# Patient Record
Sex: Female | Born: 1970 | Race: White | Hispanic: No | Marital: Single | Smoking: Never smoker
Health system: Southern US, Community
[De-identification: ages and names within clinical notes are randomized; demographics above are authoritative.]

---

## 2007-05-31 ENCOUNTER — Observation Stay: Payer: Self-pay | Admitting: Unknown Physician Specialty

## 2007-06-28 ENCOUNTER — Ambulatory Visit: Payer: Self-pay | Admitting: Obstetrics and Gynecology

## 2007-07-12 ENCOUNTER — Observation Stay: Payer: Self-pay

## 2007-07-22 ENCOUNTER — Ambulatory Visit: Payer: Self-pay | Admitting: Obstetrics and Gynecology

## 2007-08-21 ENCOUNTER — Ambulatory Visit: Payer: Self-pay | Admitting: Obstetrics and Gynecology

## 2007-08-22 ENCOUNTER — Inpatient Hospital Stay: Payer: Self-pay

## 2007-09-26 ENCOUNTER — Ambulatory Visit: Payer: Self-pay | Admitting: Unknown Physician Specialty

## 2007-11-05 ENCOUNTER — Emergency Department: Payer: Self-pay | Admitting: Emergency Medicine

## 2009-08-14 IMAGING — US US THYROID
1 series · 17 of 25 positions shown · non-contrast
Comparison: none

REASON FOR EXAM: thyroid nodule    hypothyroid
COMMENTS:

[Series 1: us thyroid · 17 of 48 slices shown]
[im 1/48]
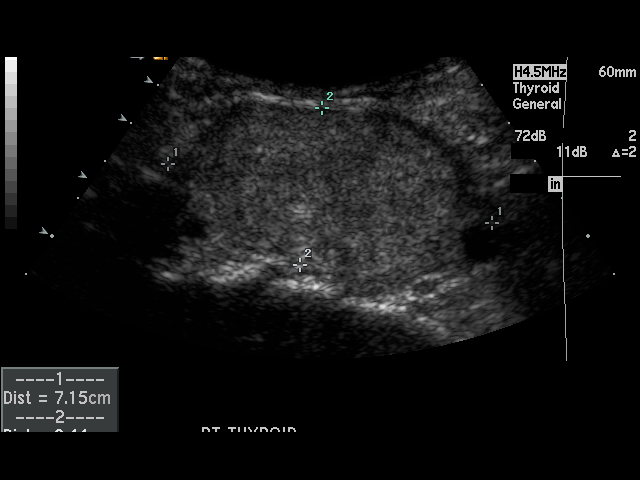
[im 4/48]
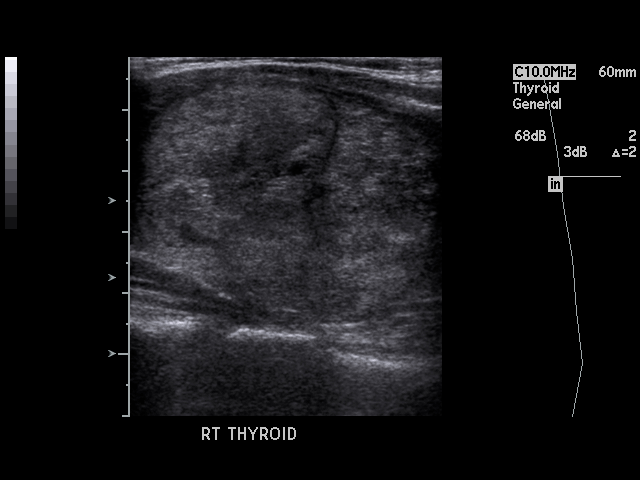
[im 6/48]
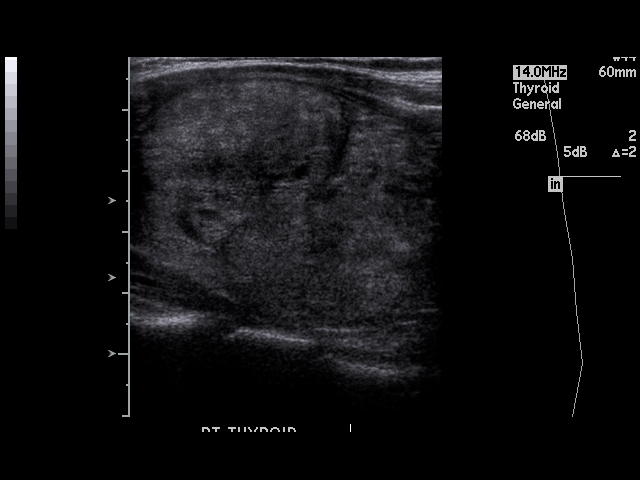
[im 10/48]
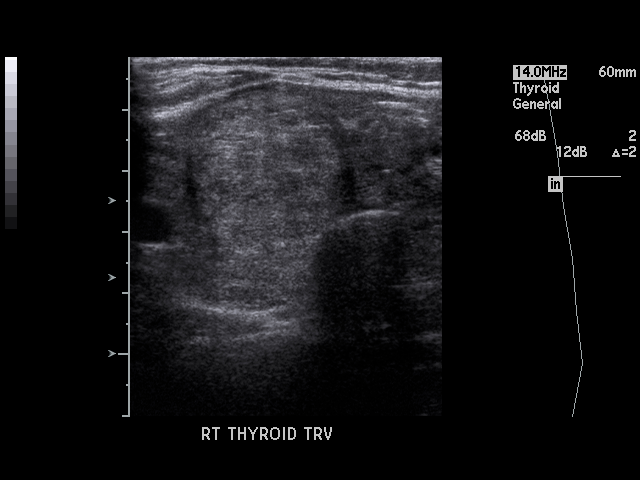
[im 12/48]
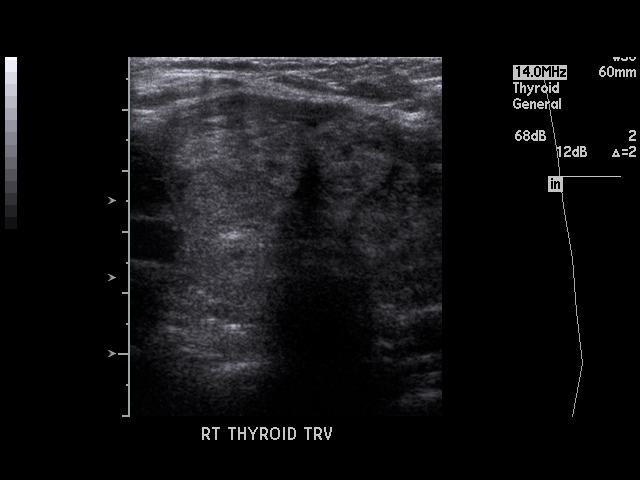
[im 16/48]
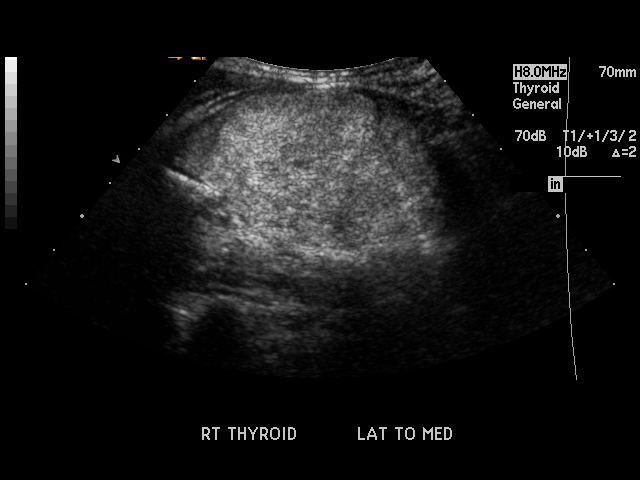
[im 18/48]
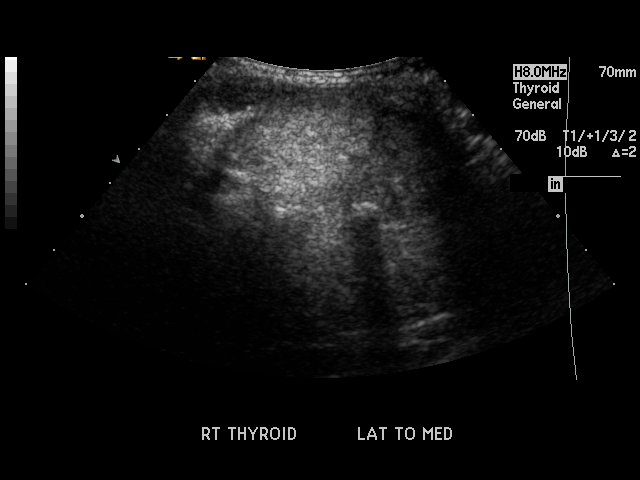
[im 22/48]
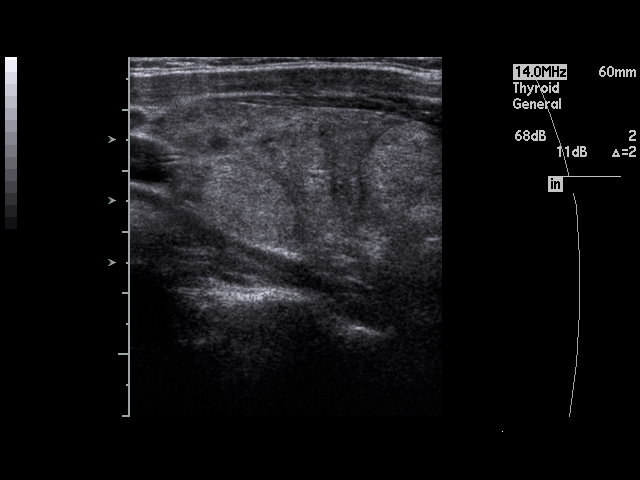
[im 24/48]
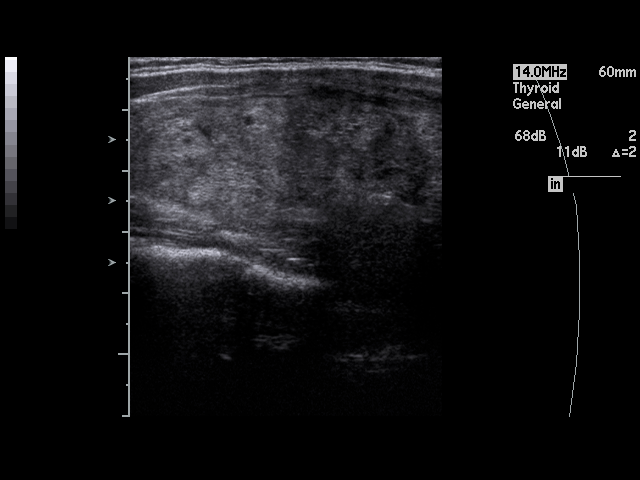
[im 26/48]
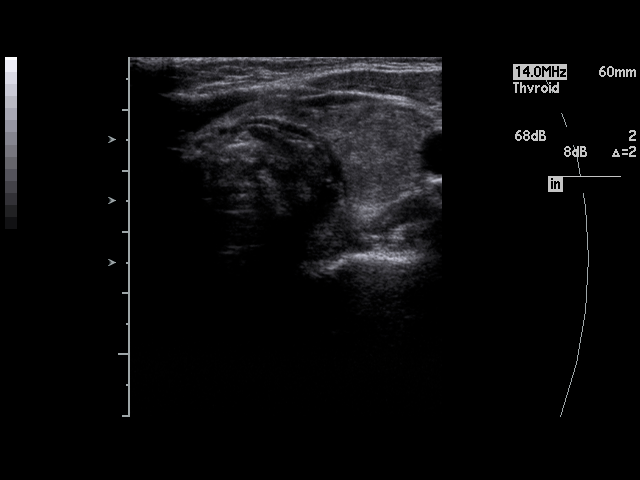
[im 30/48]
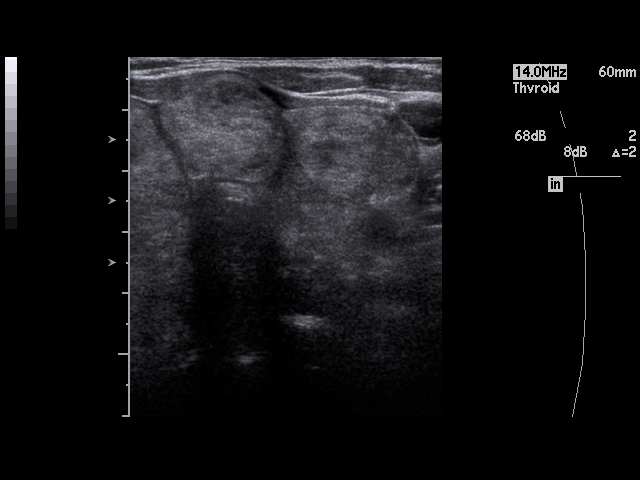
[im 32/48]
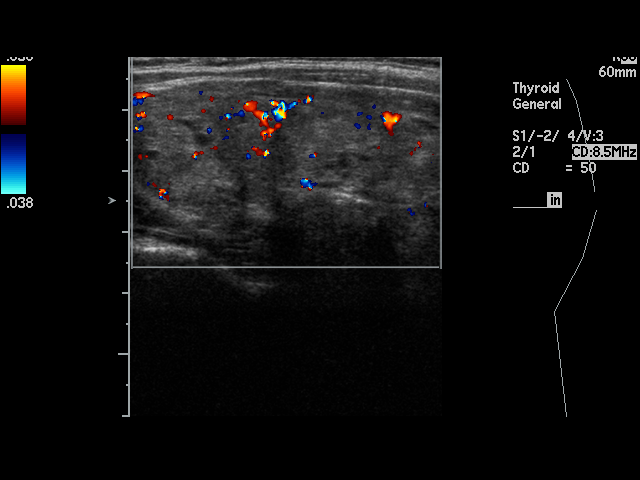
[im 36/48]
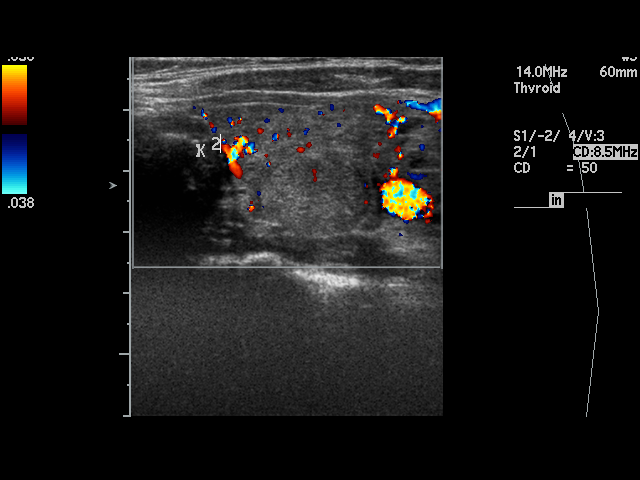
[im 38/48]
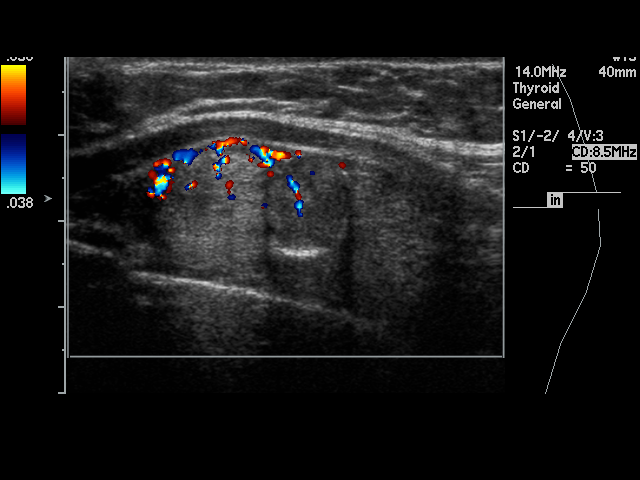
[im 42/48]
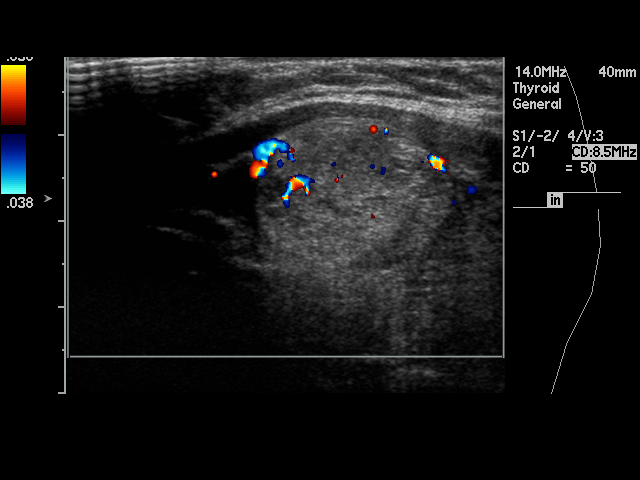
[im 44/48]
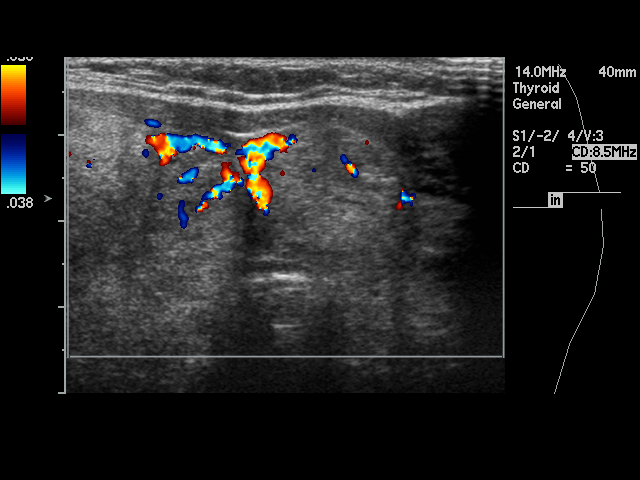
[im 48/48]
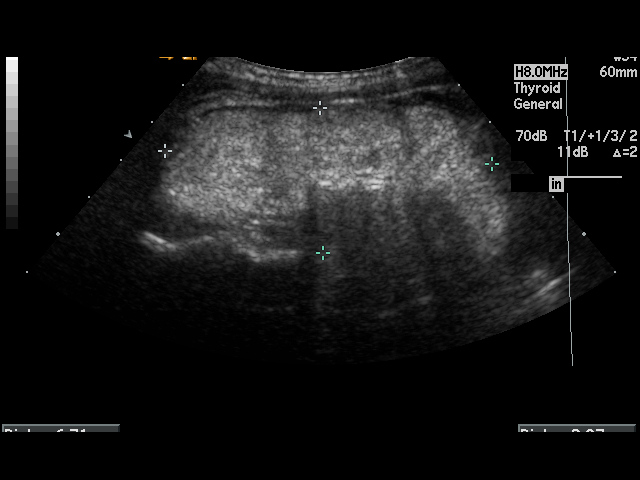

[17 of 25 positions shown; findings below may reference images not displayed]

PROCEDURE:     US  - US THYROID  - September 26, 2007 [DATE]

RESULT:     The RIGHT lobe of the thyroid measures 7.15 cm x 3.44 cm x
cm, and the LEFT lobe measures 6.71 cm x 2.97 cm x 2.71 cm. The thyroid
echotexture is heterogeneous bilaterally. In the mid and lower pole region
of the RIGHT lobe, there is a solid nodule that measures 5.08 cm x 3.47 cm x
3.66 cm. In the lower pole of the LEFT lobe, there is a solid hypoechoic
nodule measuring 1.99 cm at maximum diameter.  In the isthmus, there are two
hypoechoic solid nodules with the larger measuring 2.32 cm at maximum
diameter and the smaller measuring 1.14 cm at maximum diameter.
IMPRESSION: 1.     The thyroid lobes are enlarged.
2.     There is a heterogeneous echotexture to both lobes of the thyroid.
3.     Multiple thyroid nodules are observed as described above. The largest
is in the RIGHT lobe and measures 5.08 cm at maximum outer.
4.     No calcifications in the thyroid lobes or thyroid nodules are
identified.

## 2012-04-25 ENCOUNTER — Ambulatory Visit: Payer: Self-pay

## 2014-03-14 IMAGING — CR RIGHT HIP - COMPLETE 2+ VIEW
1 series · 2 of 2 positions shown · non-contrast
Comparison: none

REASON FOR EXAM: back problems, joint problems carpal tunnel syndrome
COMMENTS:

PROCEDURE:     DXR - DXR HIP RIGHT COMPLETE  - April 25, 2012 [DATE]
RESULT:     Right hip images demonstrate no definite fracture, dislocation
or radiopaque foreign body.

[Series 1: t hip ap right · 0.14mm/px · 2 of 2 slices shown]
[im 1/2]
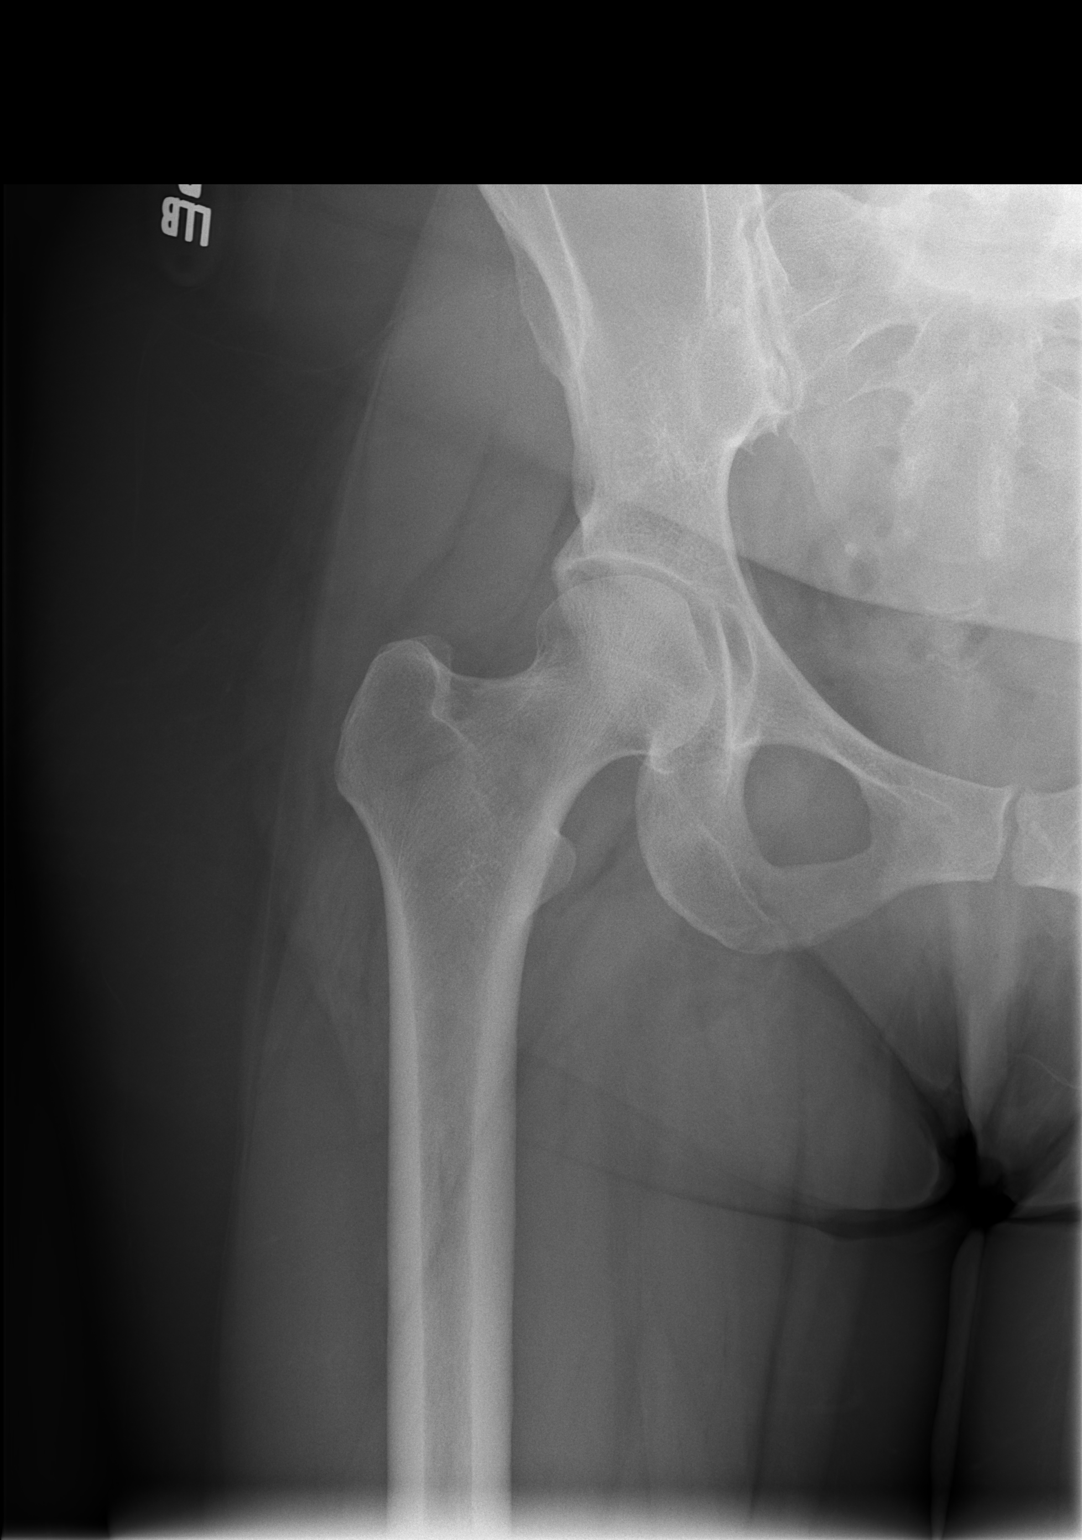
[im 2/2]
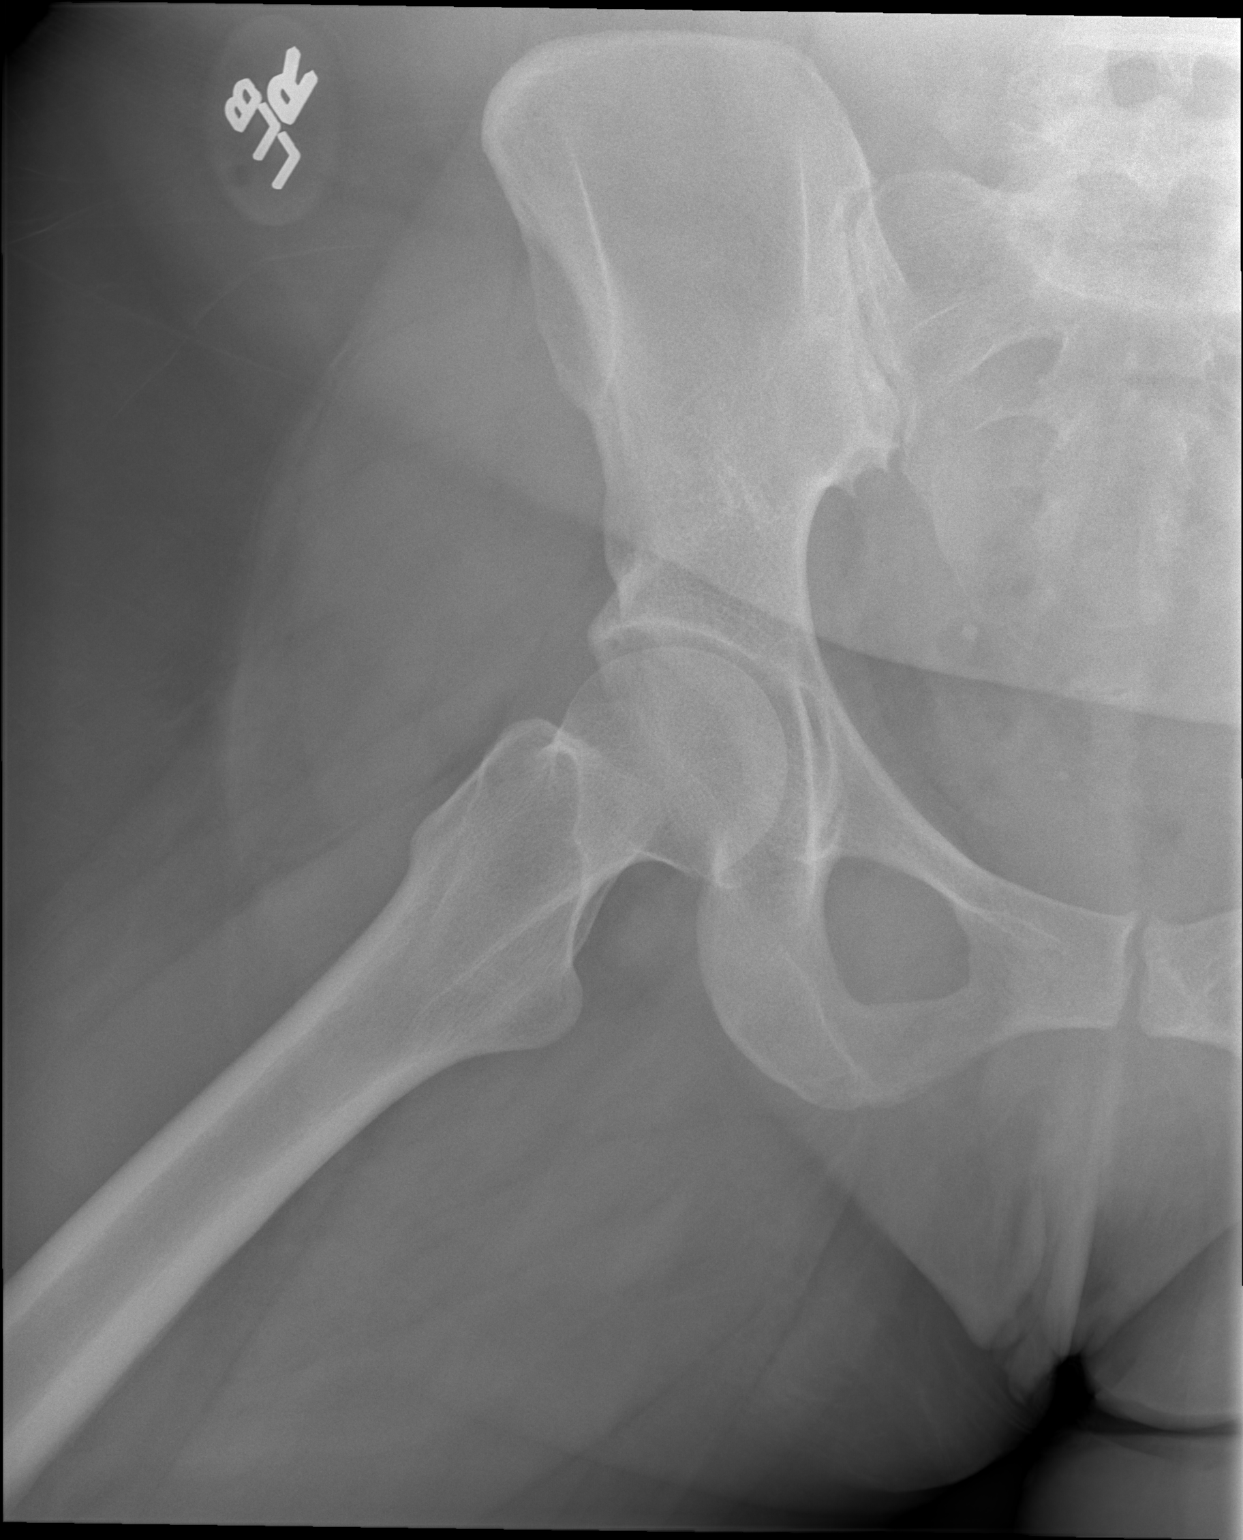

[2 of 2 positions shown; findings below may reference images not displayed]

IMPRESSION: Please see above.

[REDACTED]

## 2014-03-14 IMAGING — CR DG LUMBAR SPINE 2-3V
1 series · 3 of 3 positions shown · non-contrast
Comparison: none

REASON FOR EXAM: back problems, joint problems carpal tunnel syndrome
COMMENTS:

[Series 1: t lumbar spine ap · 0.14mm/px · 3 of 3 slices shown]
[im 1/3]
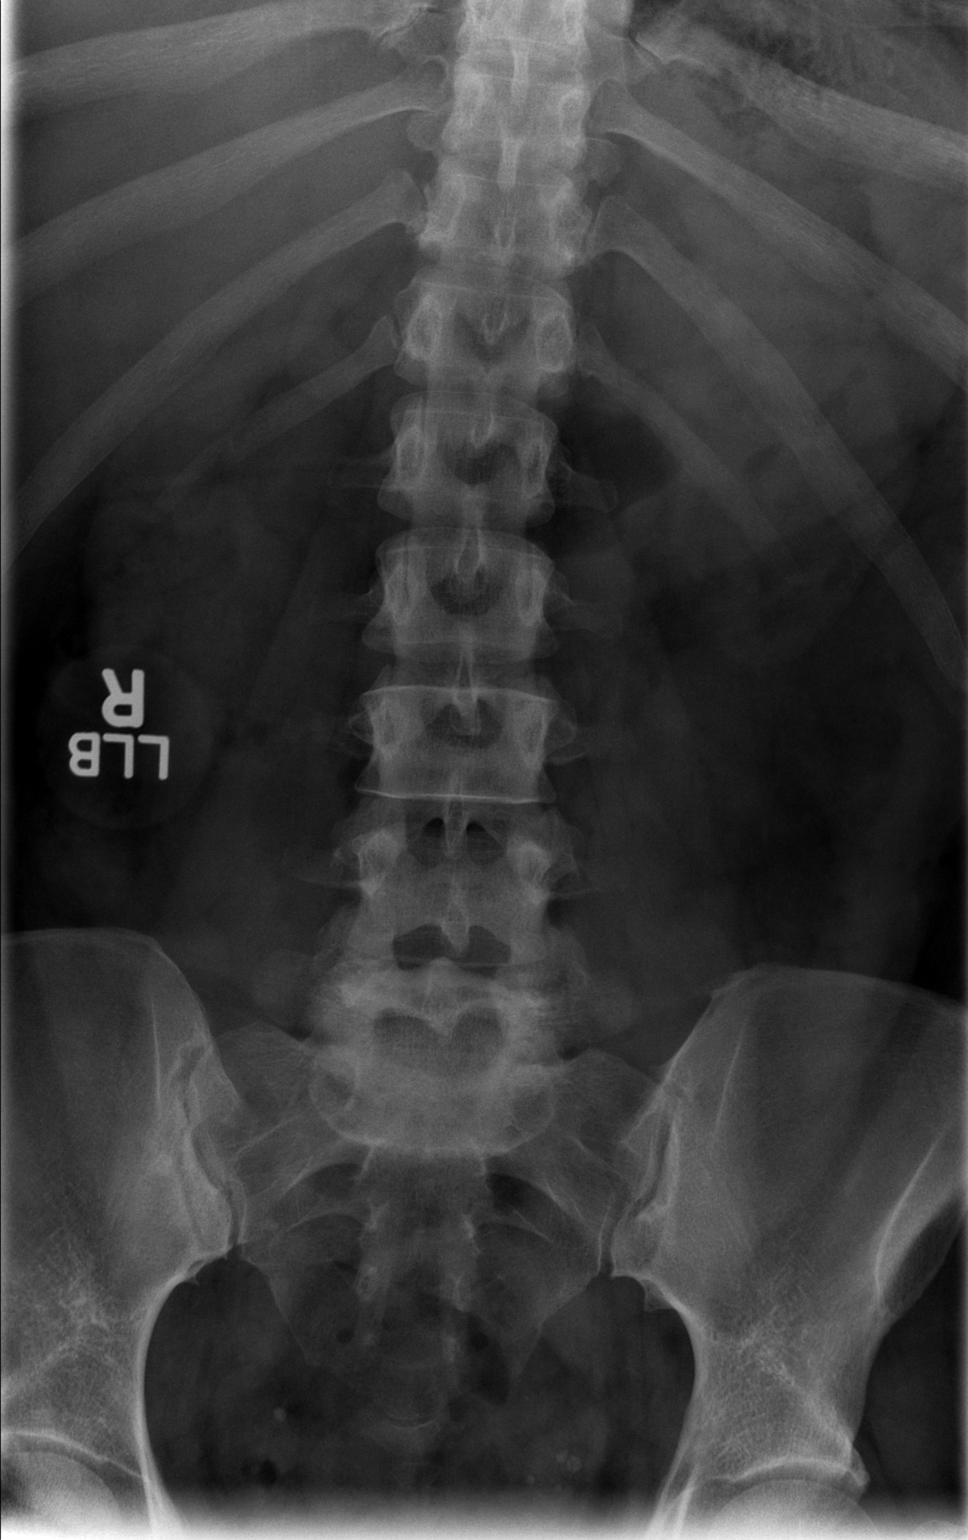
[im 2/3]
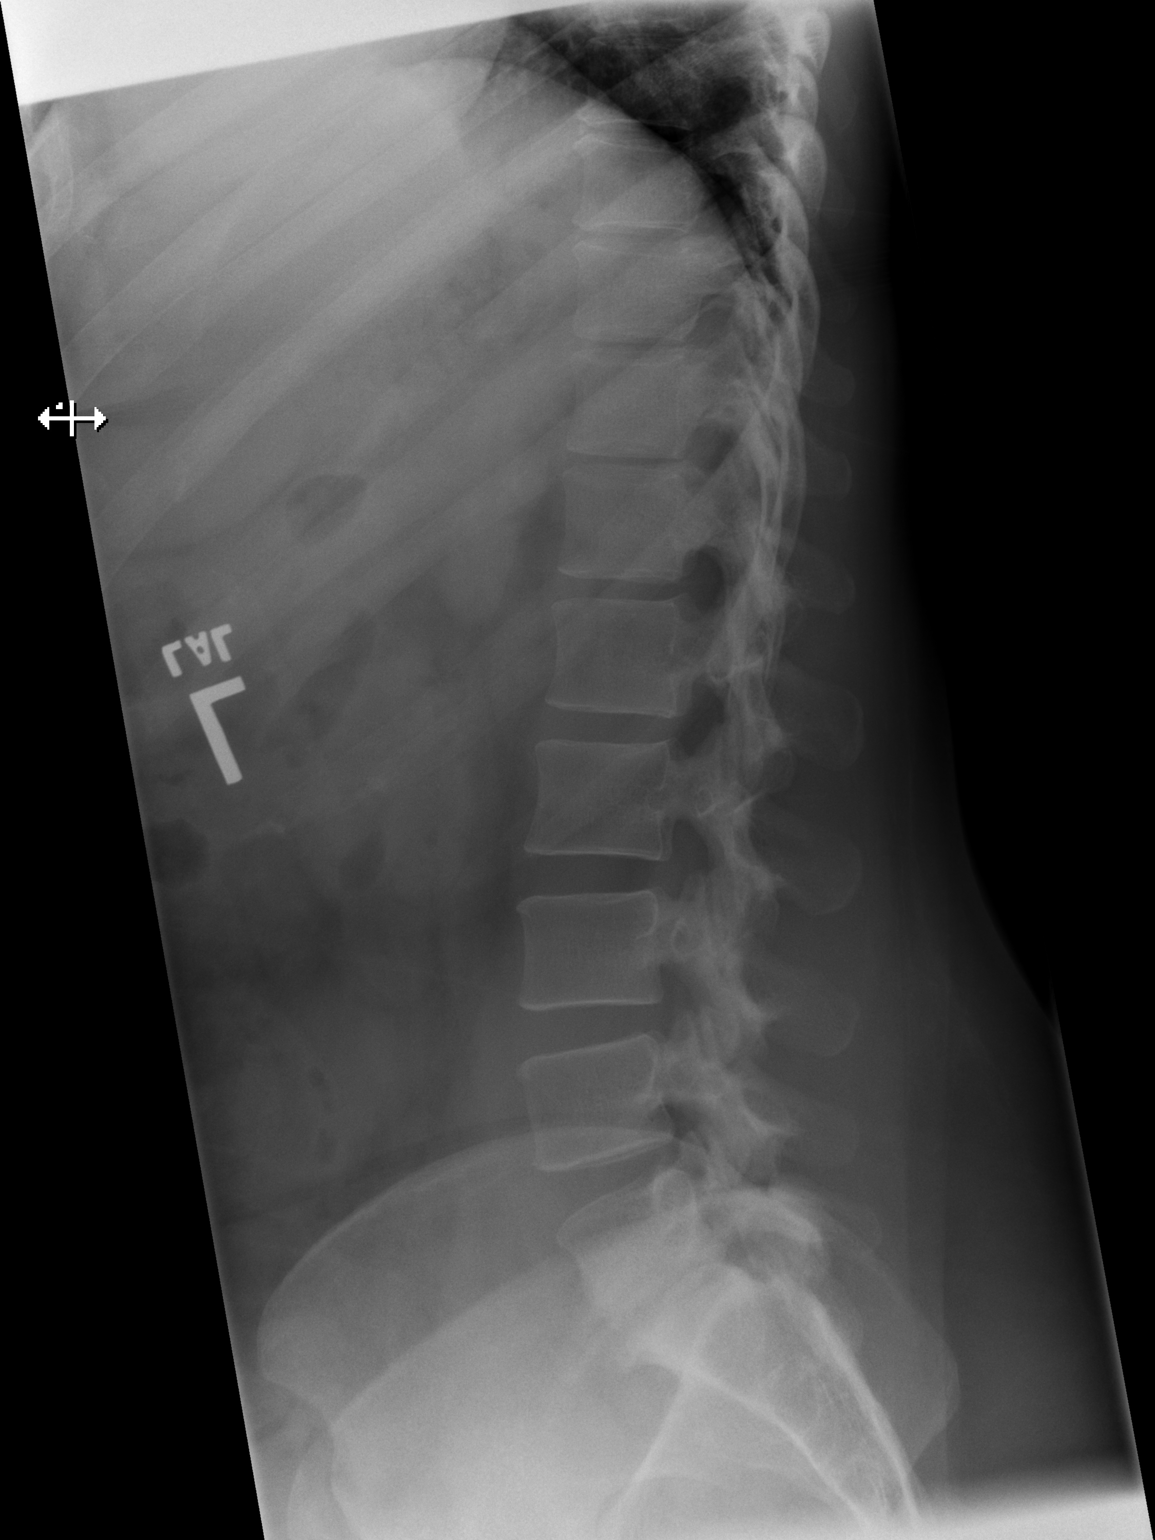
[im 3/3]
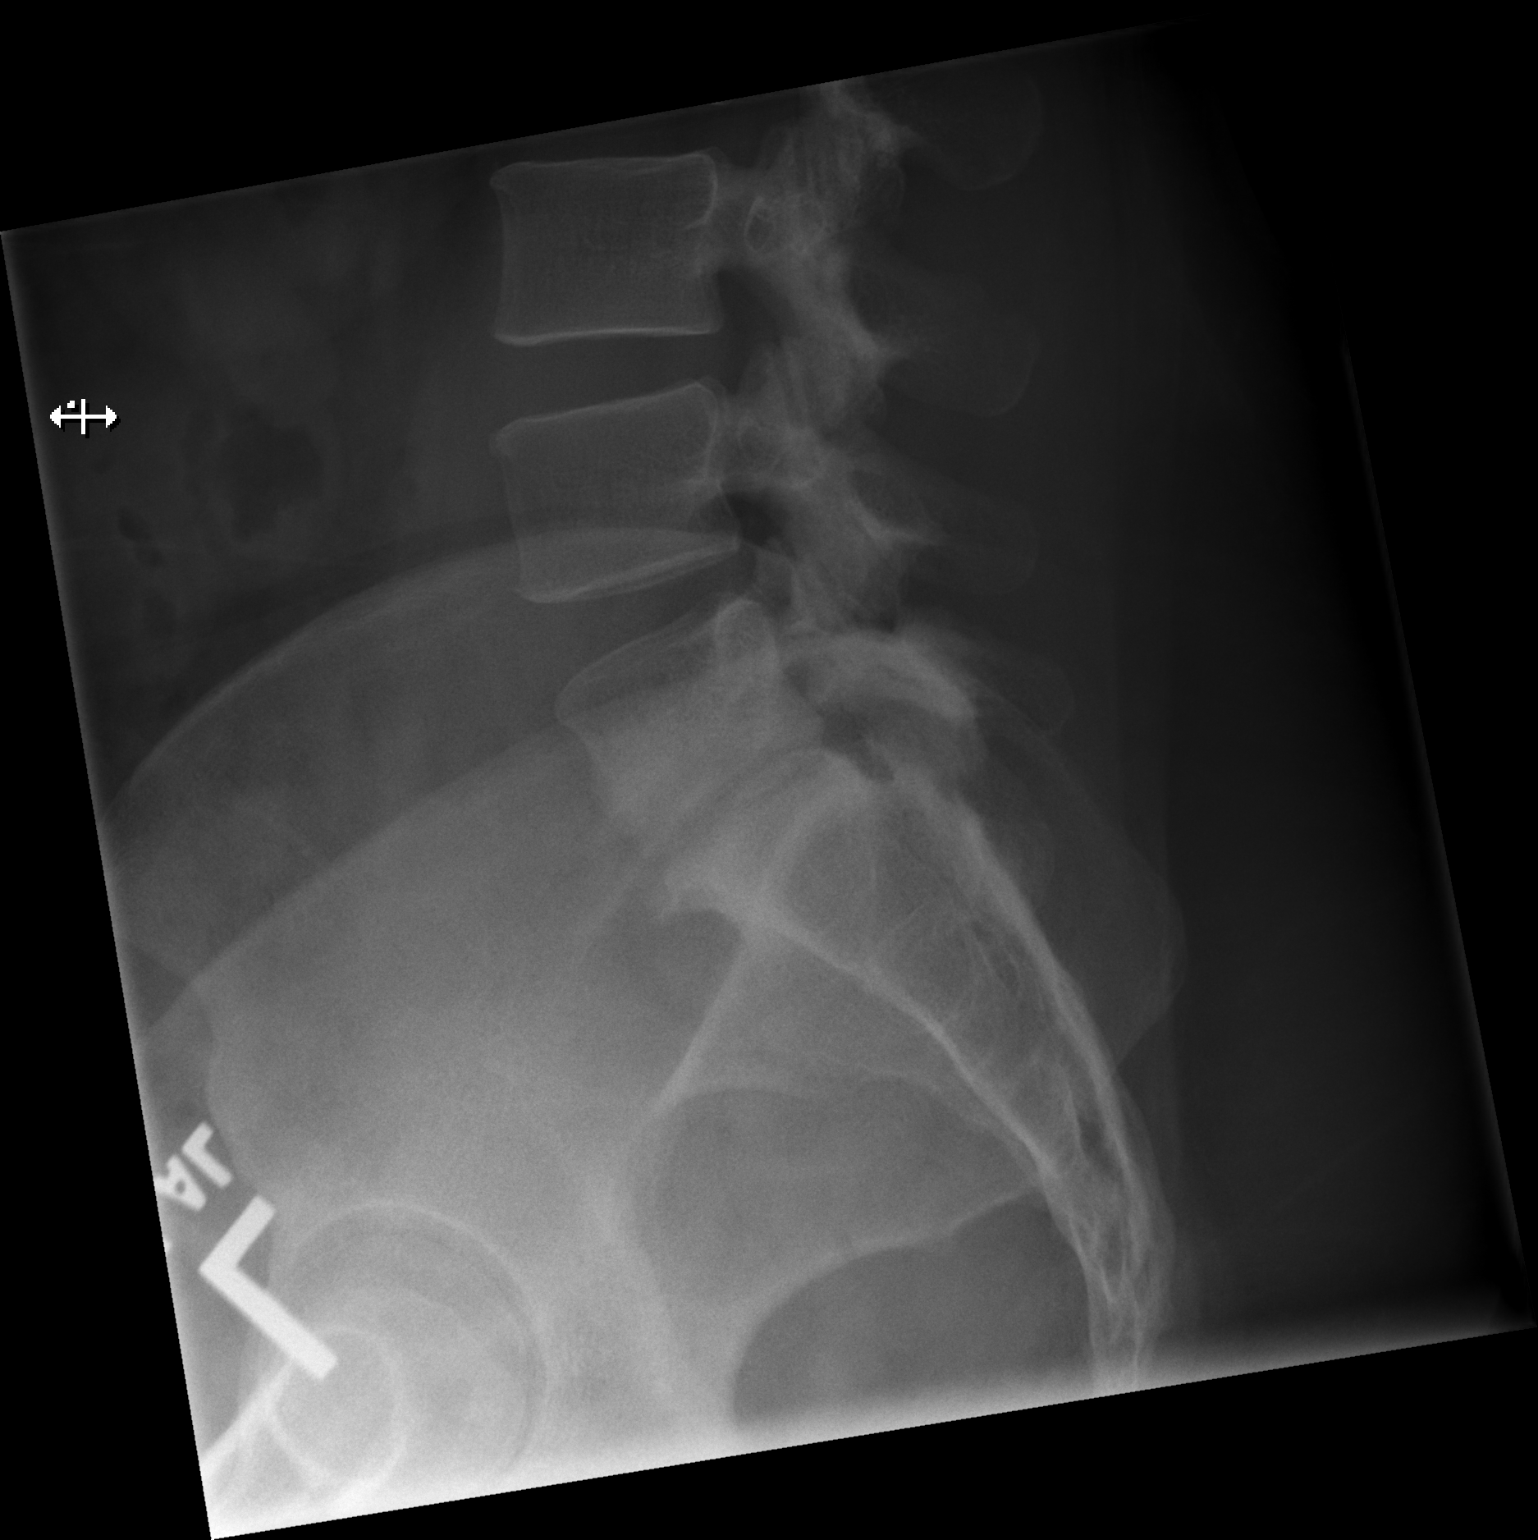

[3 of 3 positions shown; findings below may reference images not displayed]

PROCEDURE:     DXR - DXR LUMBAR SPINE AP AND LATERAL  - April 25, 2012 [DATE]

RESULT:     The vertebral body heights, intervertebral disc spaces and
spinal alignment are maintained. There is no fracture or significant
degenerative disease. Mild L5-S1 disc space narrowing is noted. There is
some degenerative endplate spurring at L5-S1.
IMPRESSION: L5-S1 degenerative change. No acute bony abnormality
evident. MRI is available for further assessment.

[REDACTED]

## 2017-09-13 ENCOUNTER — Other Ambulatory Visit: Payer: Self-pay

## 2017-09-13 ENCOUNTER — Encounter: Payer: Self-pay | Admitting: Emergency Medicine

## 2017-09-13 ENCOUNTER — Emergency Department
Admission: EM | Admit: 2017-09-13 | Discharge: 2017-09-13 | Disposition: A | Discharge status: Home / Self Care | Payer: Self-pay | Attending: Emergency Medicine | Admitting: Emergency Medicine

## 2017-09-13 DIAGNOSIS — Y929 Unspecified place or not applicable: Secondary | ICD-10-CM | POA: Insufficient documentation

## 2017-09-13 DIAGNOSIS — S025XXA Fracture of tooth (traumatic), initial encounter for closed fracture: Secondary | ICD-10-CM | POA: Insufficient documentation

## 2017-09-13 DIAGNOSIS — X58XXXA Exposure to other specified factors, initial encounter: Secondary | ICD-10-CM | POA: Insufficient documentation

## 2017-09-13 DIAGNOSIS — Y939 Activity, unspecified: Secondary | ICD-10-CM | POA: Insufficient documentation

## 2017-09-13 DIAGNOSIS — Y999 Unspecified external cause status: Secondary | ICD-10-CM | POA: Insufficient documentation

## 2017-09-13 MED ORDER — LIDOCAINE VISCOUS HCL 2 % MT SOLN
15.0000 mL | Freq: Once | OROMUCOSAL | Status: AC
  Administered 2017-09-13: 15 mL via OROMUCOSAL
  Filled 2017-09-13: qty 15

## 2017-09-13 MED ORDER — OXYCODONE-ACETAMINOPHEN 7.5-325 MG PO TABS: 1.0000 | ORAL_TABLET | Freq: Four times a day (QID) | ORAL | 0 refills | Status: AC | PRN

## 2017-09-13 MED ORDER — AMOXICILLIN 500 MG PO CAPS: 500.0000 mg | ORAL_CAPSULE | Freq: Three times a day (TID) | ORAL | 0 refills | Status: AC

## 2017-09-13 MED ORDER — IBUPROFEN 800 MG PO TABS: 800.0000 mg | ORAL_TABLET | Freq: Three times a day (TID) | ORAL | 0 refills | Status: AC | PRN

## 2017-09-13 NOTE — ED Triage Notes (Signed)
C/O broken upper tooth, c/o pain and swelling x 3 days.

## 2017-09-13 NOTE — ED Provider Notes (Signed)
[facility] Emergency Department Provider Note   ____________________________________________   First MD Initiated Contact with Patient 09/13/17 1126     (approximate)  I have reviewed the triage vital signs and the nursing notes.   HISTORY  Chief Complaint Dental Pain    HPI Johnney Killianaige M Frediani is a 47 y.o. female complained of dental pain secondary to fractured tooth.  Patient states decades ago she had a root canal but 3 days ago it cracked.  Patient also states she has a new onset of fractured tooth different location.  Patient states today she notes increased gingiva edema and pain.  Patient [facility] and will make an appoint with them next week.  Patient rates the pain as 8/10.  No palliative measure for complaint.   History reviewed. No pertinent past medical history.  There are no active problems to display for this patient.   History reviewed. No pertinent surgical history.  Prior to Admission medications   Medication Sig Start Date End Date Taking? Authorizing Provider  amoxicillin (AMOXIL) 500 MG capsule Take 1 capsule (500 mg total) by mouth 3 (three) times daily. 09/13/17   Joni ReiningSmith, Fue Cervenka K, PA-C  ibuprofen (ADVIL,MOTRIN) 800 MG tablet Take 1 tablet (800 mg total) by mouth every 8 (eight) hours as needed for moderate pain. 09/13/17   Joni ReiningSmith, Angelise Petrich K, PA-C  oxyCODONE-acetaminophen (PERCOCET) 7.5-325 MG tablet Take 1 tablet by mouth every 6 (six) hours as needed. 09/13/17   Joni ReiningSmith, Katielynn Horan K, PA-C    Allergies Patient has no known allergies.  No family history on file.  Social History Social History   Tobacco Use  . Smoking status: Never Smoker  . Smokeless tobacco: Never Used  Substance Use Topics  . Alcohol use: Not on file  . Drug use: Not on file    Review of Systems Constitutional: No fever/chills Eyes: No visual changes. ENT: No sore throat. Cardiovascular: Denies chest pain. Respiratory: Denies  shortness of breath. Gastrointestinal: No abdominal pain.  No nausea, no vomiting.  No diarrhea.  No constipation. Genitourinary: Negative for dysuria. Musculoskeletal: Negative for back pain. Skin: Negative for rash. Neurological: Negative for headaches, focal weakness or numbness. Endocrine:Hypothyroidism.   ____________________________________________   PHYSICAL EXAM:  VITAL SIGNS: ED Triage Vitals  Enc Vitals Group     BP 09/13/17 1124 (!) 158/83     Pulse Rate 09/13/17 1124 84     Resp 09/13/17 1124 16     Temp 09/13/17 1124 98.2 F (36.8 C)     Temp Source 09/13/17 1124 Oral     SpO2 09/13/17 1124 97 %     Weight 09/13/17 1110 165 lb (74.8 kg)     Height 09/13/17 1110 5\' 3"  (1.6 m)     Head Circumference --      Peak Flow --      Pain Score 09/13/17 1109 8     Pain Loc --      Pain Edu? --      Excl. in GC? --     Constitutional: Alert and oriented. Well appearing and in no acute distress. Eyes: Conjunctivae are normal. PERRL. EOMI. Head: Atraumatic. Nose: No congestion/rhinnorhea. Mouth/Throat: Mucous membranes are moist.  Oropharynx non-erythematous.  Fractured tooth #14 and 18.  Mild gingival edema. Neck: No stridor.   Cardiovascular: Normal rate, regular rhythm. Grossly normal heart sounds.  Good peripheral circulation.  Elevated systolic blood pressure. Respiratory: Normal respiratory effort.  No retractions. Lungs CTAB. Neurologic:  Normal speech and language. No gross focal neurologic deficits are appreciated. No gait instability. Skin:  Skin is warm, dry and intact. No rash noted. Psychiatric: Mood and affect are normal. Speech and behavior are normal.  ____________________________________________   LABS (all labs ordered are listed, but only abnormal results are displayed)  Labs Reviewed - No data to display ____________________________________________  EKG   ____________________________________________  RADIOLOGY  ED MD interpretation:     Official radiology report(s): No results found.  ____________________________________________   PROCEDURES  Procedure(s) performed: None  Procedures  Critical Care performed: No  ____________________________________________   INITIAL IMPRESSION / ASSESSMENT AND PLAN / ED COURSE  As part of my medical decision making, I reviewed the following data within the electronic MEDICAL RECORD NUMBER    Dental pain secondary to fractured tooth.  Patient given discharge care instruction.  Patient advised take medication as directed.  Patient advised follow-up with treating dentist or from provided list.      ____________________________________________   FINAL CLINICAL IMPRESSION(S) / ED DIAGNOSES  Final diagnoses:  Closed fracture of tooth, initial encounter     ED Discharge Orders        Ordered    oxyCODONE-acetaminophen (PERCOCET) 7.5-325 MG tablet  Every 6 hours PRN     09/13/17 1131    amoxicillin (AMOXIL) 500 MG capsule  3 times daily     09/13/17 1131    ibuprofen (ADVIL,MOTRIN) 800 MG tablet  Every 8 hours PRN     09/13/17 1131       Note:  This document was prepared using Dragon voice recognition software and may include unintentional dictation errors.    Joni Reining, PA-C 09/13/17 1137    Sharman Cheek, MD 09/17/17 [phone]

## 2017-09-13 NOTE — ED Notes (Signed)
Pt states she is having pain from a broken tooth and has had some swelling for a few days. Pt speaking in clear sentences.

## 2017-09-13 NOTE — Discharge Instructions (Signed)
Follow-up from list of dental clinics or radiation of treating dentist.

## 2019-05-19 ENCOUNTER — Ambulatory Visit: Payer: Self-pay | Attending: Internal Medicine

## 2019-05-19 ENCOUNTER — Other Ambulatory Visit: Payer: Self-pay

## 2019-05-19 DIAGNOSIS — Z23 Encounter for immunization: Secondary | ICD-10-CM

## 2019-05-19 NOTE — Progress Notes (Signed)
   Covid-19 Vaccination Clinic  Name:  Makayla Diaz    MRN: [id number] DOB: [birth year]  05/19/2019  Makayla Diaz was observed post Covid-19 immunization for 15 minutes without incident. She was provided with Vaccine Information Sheet and instruction to access the V-Safe system.   Makayla Diaz was instructed to call 911 with any severe reactions post vaccine: Marland Kitchen Difficulty breathing  . Swelling of face and throat  . A fast heartbeat  . A bad rash all over body  . Dizziness and weakness   Immunizations Administered    Name Date Dose VIS Date Route   Pfizer COVID-19 Vaccine 05/19/2019 10:03 AM 0.3 mL 01/31/2019 Intramuscular   Manufacturer: [employer], [employer]   Lot: [id number]   NDC: [address]-0

## 2019-06-17 ENCOUNTER — Ambulatory Visit: Payer: Self-pay | Attending: Internal Medicine

## 2019-06-17 DIAGNOSIS — Z23 Encounter for immunization: Secondary | ICD-10-CM

## 2019-06-17 NOTE — Progress Notes (Signed)
   Covid-19 Vaccination Clinic  Name:  EMMARY CULBREATH    MRN: [id number] DOB: August 26, 1970  06/17/2019  Ms. Longshore was observed post Covid-19 immunization for 15 minutes without incident. She was provided with Vaccine Information Sheet and instruction to access the V-Safe system.   Ms. Mcchristian was instructed to call 911 with any severe reactions post vaccine: Marland Kitchen Difficulty breathing  . Swelling of face and throat  . A fast heartbeat  . A bad rash all over body  . Dizziness and weakness   Immunizations Administered    Name Date Dose VIS Date Route   Pfizer COVID-19 Vaccine 06/17/2019  9:58 AM 0.3 mL 04/16/2018 Intramuscular   Manufacturer: [employer], [employer]   Lot: [id number]   NDC: [address]-9

## 2022-07-27 ENCOUNTER — Emergency Department
Admission: EM | Admit: 2022-07-27 | Discharge: 2022-07-27 | Disposition: A | Discharge status: Home / Self Care | Payer: Non-veteran care | Attending: Emergency Medicine | Admitting: Emergency Medicine

## 2022-07-27 ENCOUNTER — Encounter: Payer: Self-pay | Admitting: Medical Oncology

## 2022-07-27 DIAGNOSIS — Z7984 Long term (current) use of oral hypoglycemic drugs: Secondary | ICD-10-CM | POA: Diagnosis not present

## 2022-07-27 DIAGNOSIS — R42 Dizziness and giddiness: Secondary | ICD-10-CM | POA: Insufficient documentation

## 2022-07-27 DIAGNOSIS — E039 Hypothyroidism, unspecified: Secondary | ICD-10-CM | POA: Diagnosis not present

## 2022-07-27 DIAGNOSIS — E119 Type 2 diabetes mellitus without complications: Secondary | ICD-10-CM | POA: Diagnosis not present

## 2022-07-27 DIAGNOSIS — R791 Abnormal coagulation profile: Secondary | ICD-10-CM | POA: Diagnosis not present

## 2022-07-27 DIAGNOSIS — R531 Weakness: Secondary | ICD-10-CM | POA: Diagnosis not present

## 2022-07-27 DIAGNOSIS — N939 Abnormal uterine and vaginal bleeding, unspecified: Secondary | ICD-10-CM | POA: Insufficient documentation

## 2022-07-27 LAB — BASIC METABOLIC PANEL
Anion gap: 7 (ref 5–15)
BUN: 14 mg/dL (ref 6–20)
CO2: 23 mmol/L (ref 22–32)
Calcium: 8 mg/dL — ABNORMAL LOW (ref 8.9–10.3)
Chloride: 108 mmol/L (ref 98–111)
Creatinine, Ser: 0.61 mg/dL (ref 0.44–1.00)
GFR, Estimated: >60 mL/min (ref >60)
Glucose, Bld: 170 mg/dL — ABNORMAL HIGH (ref 70–99)
Potassium: 3.9 mmol/L (ref 3.5–5.1)
Sodium: 138 mmol/L (ref 135–145)

## 2022-07-27 LAB — CBC
HCT: 36.6 % (ref 36.0–46.0)
Hemoglobin: 11.5 g/dL — ABNORMAL LOW (ref 12.0–15.0)
MCH: 30 pg (ref 26.0–34.0)
MCHC: 31.4 g/dL (ref 30.0–36.0)
MCV: 95.6 fL (ref 80.0–100.0)
Platelets: 246 10*3/uL (ref 150–400)
RBC: 3.83 MIL/uL — ABNORMAL LOW (ref 3.87–5.11)
RDW: 12.6 % (ref 11.5–15.5)
WBC: 6.7 10*3/uL (ref 4.0–10.5)
nRBC: 0 % (ref 0.0–0.2)

## 2022-07-27 LAB — PROTIME-INR
INR: 1.1 (ref 0.8–1.2)
Prothrombin Time: 14.3 seconds (ref 11.4–15.2)

## 2022-07-27 LAB — TYPE AND SCREEN
ABO/RH(D): O POS
Antibody Screen: NEGATIVE

## 2022-07-27 LAB — APTT: aPTT: 20 seconds — ABNORMAL LOW (ref 24–36)

## 2022-07-27 MED ORDER — LACTATED RINGERS IV BOLUS
1000.0000 mL | Freq: Once | INTRAVENOUS | Status: AC
  Administered 2022-07-27: 1000 mL via INTRAVENOUS

## 2022-07-27 MED ORDER — KETOROLAC TROMETHAMINE 30 MG/ML IJ SOLN
15.0000 mg | Freq: Once | INTRAMUSCULAR | Status: AC
  Administered 2022-07-27: 15 mg via INTRAVENOUS
  Filled 2022-07-27: qty 1

## 2022-07-27 NOTE — ED Notes (Signed)
Pt placed into recliner chair and immediately became very pale and diaphoretic. BP reassess, 89/63. Charge made aware.

## 2022-07-27 NOTE — ED Notes (Signed)
Attempted IV twice unsuccessfully. ° °

## 2022-07-27 NOTE — Discharge Instructions (Addendum)
Use naproxen/Aleve for anti-inflammatory pain relief. Use up to 500mg every 12 hours. Do not take more frequently than this. Do not use other NSAIDs (ibuprofen, Advil) while taking this medication. It is safe to take Tylenol with this.    

## 2022-07-27 NOTE — ED Provider Notes (Signed)
[facility] Provider Note    Event Date/Time   First MD Initiated Contact with Patient 07/27/22 0320     (approximate)   History   Vaginal Bleeding   HPI  Makayla Diaz is a 52 y.o. female who presents to the ED for evaluation of Vaginal Bleeding   Patient presents from home for evaluation of heavy vaginal bleeding and feeling dizzy and weak.  She reports that she is perimenopausal and this has been going on intermittently since last fall, about 8 months now.  She has been seen twice at the [facility] ED, but she has not seen OB/GYN.  She takes no blood thinners.  She reports hypothyroidism and diabetes on Synthroid and metformin, respectively.  She is taking iron supplementation.  No anticoagulation.  She reports that she has not bled for about 3 months until tonight and she is been having heavy bleeding the past few hours.  No pain, fevers, syncope or additional or novel discharge.  She recalls a hemoglobin around 8 in the past before she was on iron supplementation.  Never been transfused   Physical Exam   Triage Vital Signs: ED Triage Vitals  Enc Vitals Group     BP 07/27/22 0258 99/62     Pulse Rate 07/27/22 0258 87     Resp 07/27/22 0258 18     Temp 07/27/22 0258 98.5 F (36.9 C)     Temp Source 07/27/22 0258 Oral     SpO2 07/27/22 0258 96 %     Weight 07/27/22 0259 163 lb 2.3 oz (74 kg)     Height 07/27/22 0259 5\' 3"  (1.6 m)     Head Circumference --      Peak Flow --      Pain Score 07/27/22 0259 0     Pain Loc --      Pain Edu? --      Excl. in GC? --     Most recent vital signs: Vitals:   07/27/22 0258  BP: 99/62  Pulse: 87  Resp: 18  Temp: 98.5 F (36.9 C)  SpO2: 96%    General: Awake, no distress.  Pale and diaphoretic, laying back, preferring to keep her eyes closed, conversational though CV:  Good peripheral perfusion.  Resp:  Normal effort.  Abd:  No distention.  Mild lower abdominal tenderness without peritoneal  features. MSK:  No deformity noted.  Neuro:  No focal deficits appreciated. Other:     ED Results / Procedures / Treatments   Labs (all labs ordered are listed, but only abnormal results are displayed) Labs Reviewed  CBC  BASIC METABOLIC PANEL  POC URINE PREG, ED    EKG   RADIOLOGY   Official radiology report(s): No results found.  PROCEDURES and INTERVENTIONS:  Procedures  Medications - No data to display   IMPRESSION / MDM / ASSESSMENT AND PLAN / ED COURSE  I reviewed the triage vital signs and the nursing notes.  Differential diagnosis includes, but is not limited to, blood loss anemia, menorrhagia, PID  {Patient presents with symptoms of an acute illness or injury that is potentially life-threatening.  Patient presents to the recurrent episode of heavy vaginal bleeding feeling presyncopal, with resolution of symptoms with some IV fluids and Toradol and suitable for outpatient management with OB follow-up.  She looks diaphoretic and pale on arrival, resolving with some IV fluids.  BP improves alongside this.  Hemoglobin is 11.5 and normocytic.  Essentially normal metabolic  panel with marginal hyperglycemia.  We discussed pelvic ultrasound, but she declines preferring to follow-up with her own OB/GYN, which think is reasonable.  She has no persistent abdominal pain or tenderness and I doubt any other intra-abdominal pathology such as appendicitis to necessitate cross-sectional imaging.  We discussed close return precautions.  Clinical Course as of 07/27/22 0522  Thu Jul 27, 2022  0406 IV placed [DS]  0521 Reassessed.  Patient reports feeling much better and is appreciative. [DS]    Clinical Course User Index [DS] Delton Prairie, MD     FINAL CLINICAL IMPRESSION(S) / ED DIAGNOSES   Final diagnoses:  None     Rx / DC Orders   ED Discharge Orders     None        Note:  This document was prepared using Dragon voice recognition software and may include  unintentional dictation errors.   Delton Prairie, MD 07/27/22 [phone]

## 2022-07-27 NOTE — ED Triage Notes (Signed)
Pt from home via ems- states that she is peri menopausal began having vaginal bleeding yesterday, over night bleeding has increased. Pt reports that she has saturated 8 tampons and 5 pads since last night. Has had this happen prior. Denies pain.
